# Patient Record
Sex: Male | Born: 1965 | Race: Black or African American | Hispanic: No | Marital: Married | Smoking: Never smoker
Health system: Southern US, Community
[De-identification: ages and names within clinical notes are randomized; demographics above are authoritative.]

---

## 2018-11-14 ENCOUNTER — Encounter (HOSPITAL_BASED_OUTPATIENT_CLINIC_OR_DEPARTMENT_OTHER): Payer: Self-pay

## 2018-11-14 ENCOUNTER — Emergency Department (HOSPITAL_BASED_OUTPATIENT_CLINIC_OR_DEPARTMENT_OTHER)
Admission: EM | Admit: 2018-11-14 | Discharge: 2018-11-14 | Disposition: A | Discharge status: Home / Self Care | Payer: 59 | Attending: Emergency Medicine | Admitting: Emergency Medicine

## 2018-11-14 ENCOUNTER — Other Ambulatory Visit: Payer: Self-pay

## 2018-11-14 ENCOUNTER — Emergency Department (HOSPITAL_BASED_OUTPATIENT_CLINIC_OR_DEPARTMENT_OTHER): Payer: 59

## 2018-11-14 DIAGNOSIS — Y9389 Activity, other specified: Secondary | ICD-10-CM | POA: Diagnosis not present

## 2018-11-14 DIAGNOSIS — Y998 Other external cause status: Secondary | ICD-10-CM | POA: Insufficient documentation

## 2018-11-14 DIAGNOSIS — S20211A Contusion of right front wall of thorax, initial encounter: Secondary | ICD-10-CM | POA: Diagnosis not present

## 2018-11-14 DIAGNOSIS — Y92410 Unspecified street and highway as the place of occurrence of the external cause: Secondary | ICD-10-CM | POA: Diagnosis not present

## 2018-11-14 DIAGNOSIS — S299XXA Unspecified injury of thorax, initial encounter: Secondary | ICD-10-CM | POA: Diagnosis present

## 2018-11-14 DIAGNOSIS — S60221A Contusion of right hand, initial encounter: Secondary | ICD-10-CM | POA: Diagnosis not present
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
## 2018-11-14 NOTE — ED Notes (Signed)
Patient verbalizes understanding of discharge instructions. Opportunity for questioning and answers were provided. Armband removed by staff, pt discharged from ED.  
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
## 2023-01-15 ENCOUNTER — Encounter (HOSPITAL_BASED_OUTPATIENT_CLINIC_OR_DEPARTMENT_OTHER): Payer: Self-pay

## 2023-01-15 ENCOUNTER — Emergency Department (HOSPITAL_BASED_OUTPATIENT_CLINIC_OR_DEPARTMENT_OTHER)
Admission: EM | Admit: 2023-01-15 | Discharge: 2023-01-15 | Disposition: A | Discharge status: Home / Self Care | Payer: BLUE CROSS/BLUE SHIELD | Attending: Emergency Medicine | Admitting: Emergency Medicine

## 2023-01-15 DIAGNOSIS — H5789 Other specified disorders of eye and adnexa: Secondary | ICD-10-CM | POA: Insufficient documentation

## 2023-01-15 DIAGNOSIS — H5711 Ocular pain, right eye: Secondary | ICD-10-CM | POA: Diagnosis present

## 2023-01-15 DIAGNOSIS — H53411 Scotoma involving central area, right eye: Secondary | ICD-10-CM

## 2023-01-15 MED ORDER — FLUORESCEIN SODIUM 1 MG OP STRP
1.0000 | ORAL_STRIP | Freq: Once | OPHTHALMIC | Status: AC
  Administered 2023-01-15: 1 via OPHTHALMIC
  Filled 2023-01-15: qty 1

## 2023-01-15 MED ORDER — TETRACAINE HCL 0.5 % OP SOLN
2.0000 [drp] | Freq: Once | OPHTHALMIC | Status: AC
  Administered 2023-01-15: 2 [drp] via OPHTHALMIC
  Filled 2023-01-15: qty 4
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
## 2023-01-15 NOTE — ED Triage Notes (Signed)
Last week accidentally got bug spray in rt. Eye.  Has been using Visine and continues to have pain and seeing flashing lights in the periphery of his rt. eye

## 2023-01-15 NOTE — Discharge Instructions (Signed)
Follow-up with ophthalmology.  Dr. Burna Sis office should get in contact with you tomorrow to make these arrangements.  Dr. Raeanne Gathers contact information has been provided in this discharge summary for you to call and make these arrangements if you have not heard from them by late morning tomorrow.
